# Patient Record
Sex: Female | Born: 1970 | Race: White | Hispanic: No | Marital: Married
Health system: Southern US, Community
[De-identification: ages and names within clinical notes are randomized; demographics above are authoritative.]

---

## 2010-03-09 ENCOUNTER — Ambulatory Visit: Payer: Self-pay | Admitting: Family Medicine

## 2010-09-14 ENCOUNTER — Emergency Department: Payer: Self-pay | Admitting: Unknown Physician Specialty

## 2011-03-10 ENCOUNTER — Emergency Department: Payer: Self-pay | Admitting: Internal Medicine

## 2011-10-10 ENCOUNTER — Emergency Department: Payer: Self-pay | Admitting: Emergency Medicine

## 2011-10-10 LAB — URINALYSIS, COMPLETE
Blood: NEGATIVE
Glucose,UR: NEGATIVE mg/dL (ref 0–75)
Ketone: NEGATIVE
Protein: 30
RBC,UR: 1 /HPF (ref 0–5)
Squamous Epithelial: 3
WBC UR: <1 /HPF (ref 0–5)

## 2011-10-10 LAB — COMPREHENSIVE METABOLIC PANEL
Albumin: 4.2 g/dL (ref 3.4–5.0)
Alkaline Phosphatase: 86 U/L (ref 50–136)
Anion Gap: 11 (ref 7–16)
BUN: 16 mg/dL (ref 7–18)
Bilirubin,Total: 0.3 mg/dL (ref 0.2–1.0)
Calcium, Total: 9.9 mg/dL (ref 8.5–10.1)
Co2: 26 mmol/L (ref 21–32)
Creatinine: 0.93 mg/dL (ref 0.60–1.30)
EGFR (African American): >60
EGFR (Non-African Amer.): >60
Potassium: 3.3 mmol/L — ABNORMAL LOW (ref 3.5–5.1)
SGOT(AST): 29 U/L (ref 15–37)
SGPT (ALT): 28 U/L
Sodium: 140 mmol/L (ref 136–145)
Total Protein: 8.1 g/dL (ref 6.4–8.2)

## 2011-10-10 LAB — CBC
HCT: 43.9 % (ref 35.0–47.0)
HGB: 14.8 g/dL (ref 12.0–16.0)
MCV: 94 fL (ref 80–100)
RBC: 4.69 10*6/uL (ref 3.80–5.20)
RDW: 13.8 % (ref 11.5–14.5)
WBC: 11.4 10*3/uL — ABNORMAL HIGH (ref 3.6–11.0)

## 2011-10-10 LAB — LIPASE, BLOOD: Lipase: 114 U/L (ref 73–393)

## 2011-11-11 ENCOUNTER — Emergency Department: Payer: Self-pay | Admitting: Emergency Medicine

## 2011-11-12 LAB — COMPREHENSIVE METABOLIC PANEL
Alkaline Phosphatase: 115 U/L (ref 50–136)
Anion Gap: 13 (ref 7–16)
Bilirubin,Total: 0.4 mg/dL (ref 0.2–1.0)
Chloride: 101 mmol/L (ref 98–107)
Co2: 21 mmol/L (ref 21–32)
Creatinine: 0.88 mg/dL (ref 0.60–1.30)
Glucose: 128 mg/dL — ABNORMAL HIGH (ref 65–99)
Osmolality: 273 (ref 275–301)
Sodium: 135 mmol/L — ABNORMAL LOW (ref 136–145)
Total Protein: 8.2 g/dL (ref 6.4–8.2)

## 2011-11-12 LAB — URINALYSIS, COMPLETE
Bacteria: NONE SEEN
Bilirubin,UR: NEGATIVE
Glucose,UR: NEGATIVE mg/dL (ref 0–75)
Leukocyte Esterase: NEGATIVE
Ph: 6 (ref 4.5–8.0)
Squamous Epithelial: 1

## 2011-11-12 LAB — CBC
MCHC: 33.4 g/dL (ref 32.0–36.0)
MCV: 92 fL (ref 80–100)
Platelet: 339 10*3/uL (ref 150–440)
RBC: 4.73 10*6/uL (ref 3.80–5.20)

## 2011-11-12 LAB — LIPASE, BLOOD: Lipase: 131 U/L (ref 73–393)

## 2011-12-06 LAB — COMPREHENSIVE METABOLIC PANEL
Alkaline Phosphatase: 71 U/L (ref 50–136)
Anion Gap: 11 (ref 7–16)
BUN: 10 mg/dL (ref 7–18)
Calcium, Total: 9.5 mg/dL (ref 8.5–10.1)
EGFR (African American): >60
EGFR (Non-African Amer.): >60
Glucose: 102 mg/dL — ABNORMAL HIGH (ref 65–99)
Osmolality: 273 (ref 275–301)
Potassium: 3.1 mmol/L — ABNORMAL LOW (ref 3.5–5.1)
SGOT(AST): 17 U/L (ref 15–37)
SGPT (ALT): 22 U/L
Sodium: 137 mmol/L (ref 136–145)
Total Protein: 7.7 g/dL (ref 6.4–8.2)

## 2011-12-06 LAB — CBC
HCT: 43.4 % (ref 35.0–47.0)
HGB: 14.9 g/dL (ref 12.0–16.0)
MCH: 31.4 pg (ref 26.0–34.0)
MCV: 92 fL (ref 80–100)
Platelet: 289 10*3/uL (ref 150–440)
WBC: 11.2 10*3/uL — ABNORMAL HIGH (ref 3.6–11.0)

## 2011-12-06 LAB — LIPASE, BLOOD: Lipase: 182 U/L (ref 73–393)

## 2011-12-07 ENCOUNTER — Inpatient Hospital Stay: Payer: Self-pay | Admitting: Surgery

## 2013-11-07 IMAGING — CT CT STONE STUDY
2 series · 12 of 16 positions shown, 15 images · non-contrast
Comparison: none

REASON FOR EXAM: LUQ and L flank pain
COMMENTS:

PROCEDURE:     CT  - CT ABDOMEN /PELVIS WO (STONE)  - October 10, 2011  [DATE]
RESULT:     Comparison: None
TECHNIQUE: Multiple axial images from the lung bases to the symphysis pubis
were obtained without oral and without intravenous contrast.

[Series 2: soft tissue · axial · 0.74mm/px · z∈[+35,+425]mm · 10 of 160 slices shown, 13 images]
[im 15/160  soft-tissue]
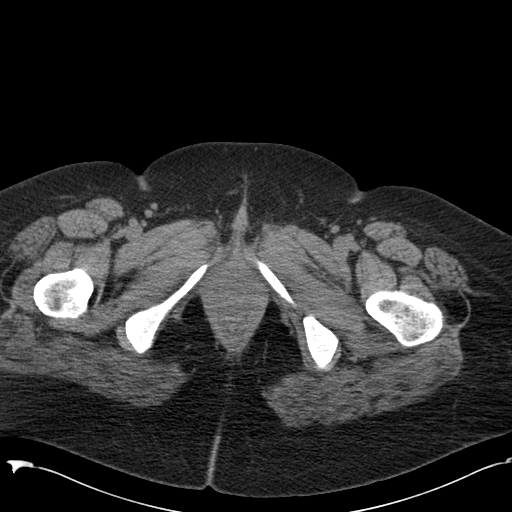
[im 15/160  bone]
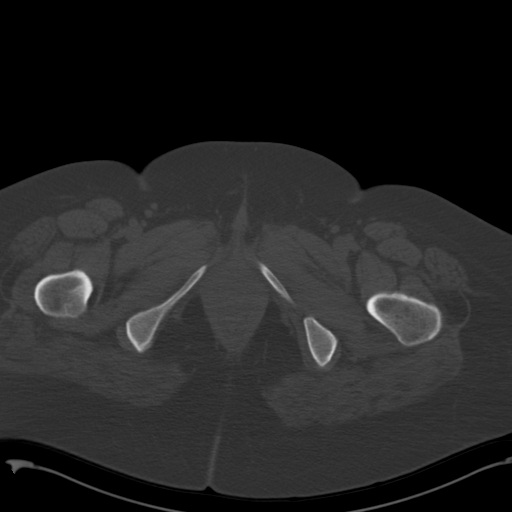
[im 29/160  soft-tissue]
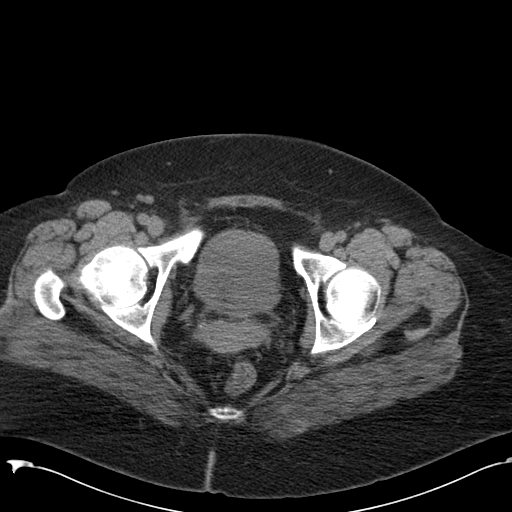
[im 44/160  soft-tissue]
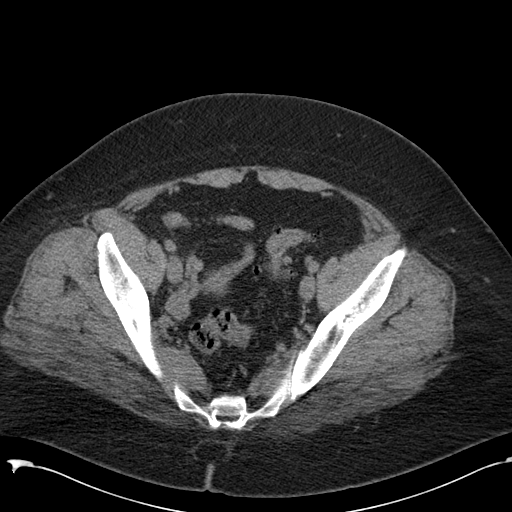
[im 58/160  soft-tissue]
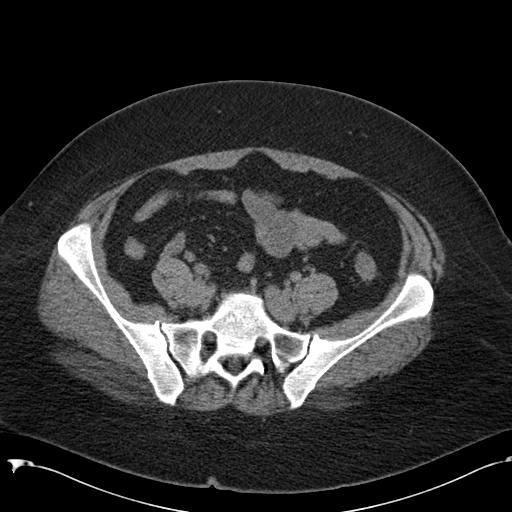
[im 73/160  soft-tissue]
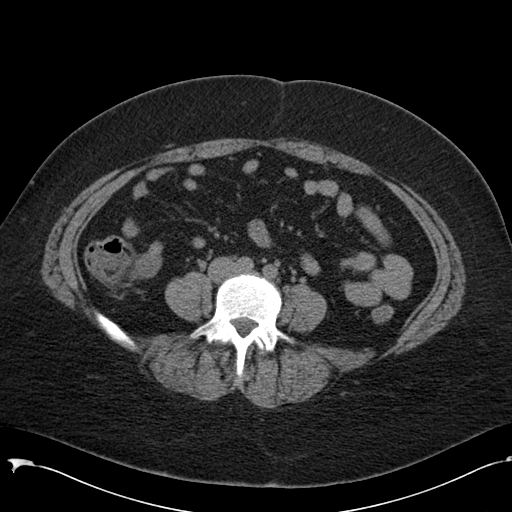
[im 73/160  bone]
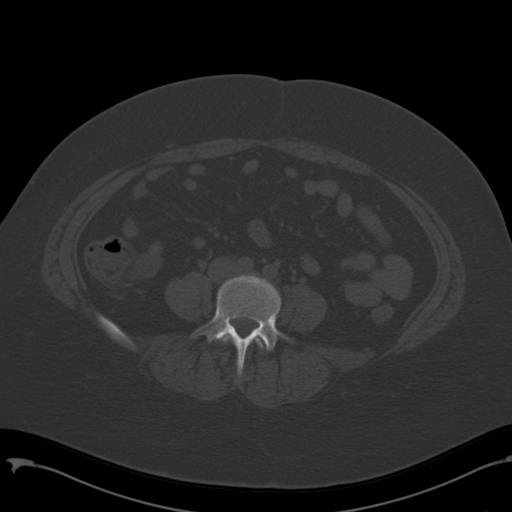
[im 87/160  soft-tissue]
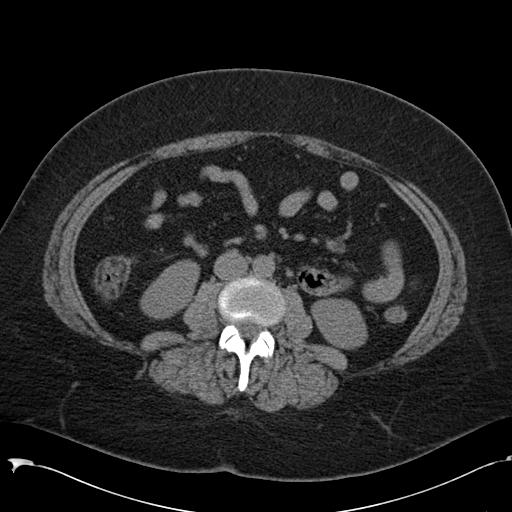
[im 102/160  soft-tissue]
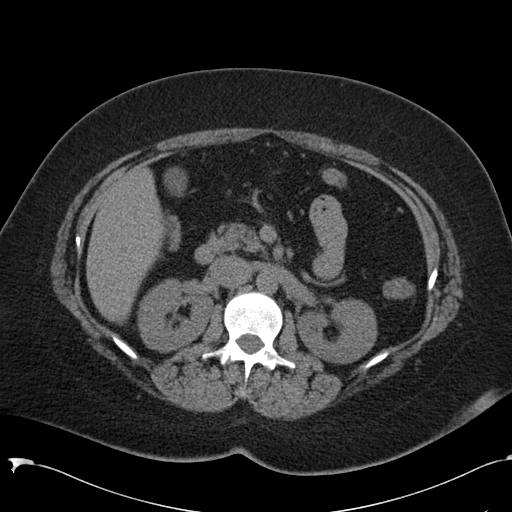
[im 116/160  soft-tissue]
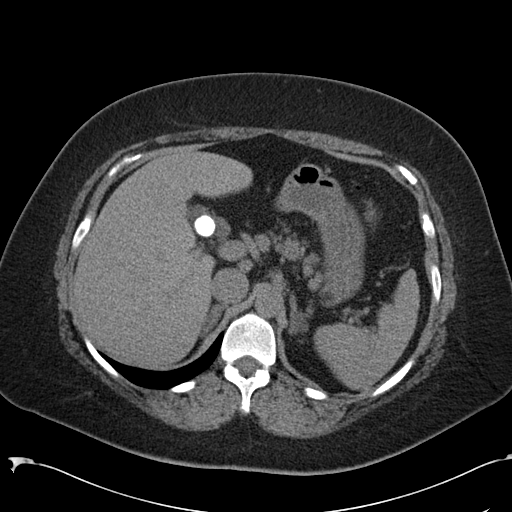
[im 131/160  soft-tissue]
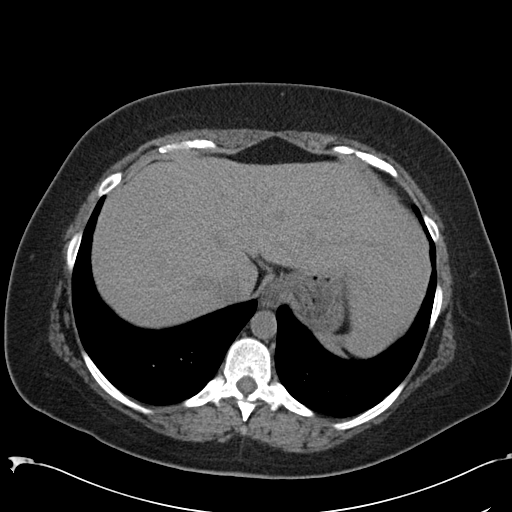
[im 131/160  bone]
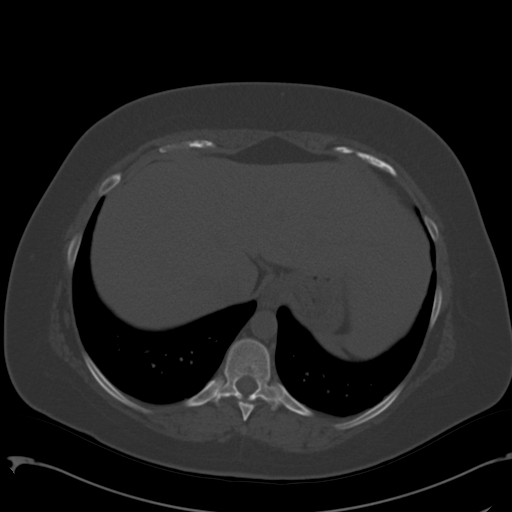
[im 145/160  soft-tissue]
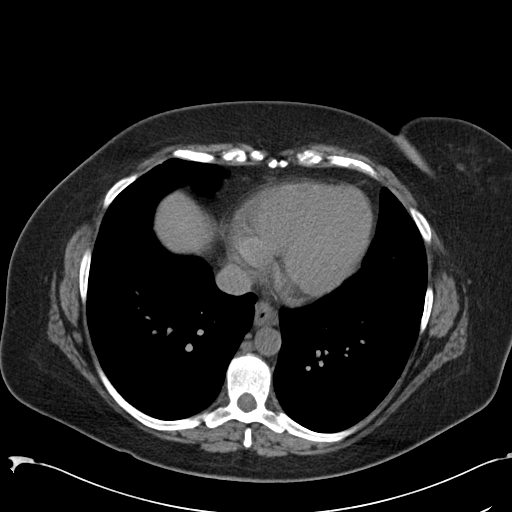

[Series 4: lung · axial · 0.74mm/px · z∈[+362,+416]mm · 2 of 54 slices shown]
[im 18/54  bone]
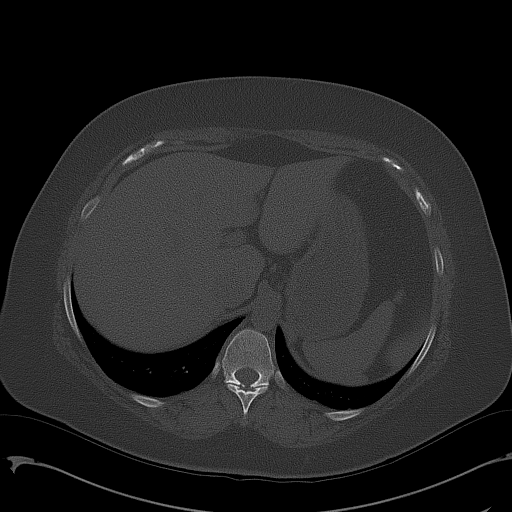
[im 36/54  bone]
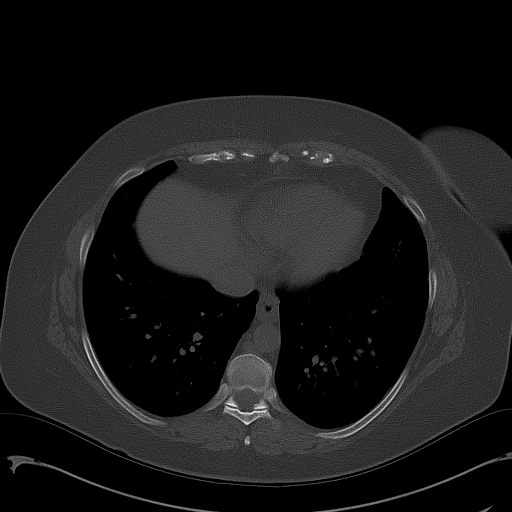

[12 of 16 positions shown; findings below may reference images not displayed]

FINDINGS: Lack of intravenous contrast limits evaluation of the solid abdominal
organs.  Grossly, the liver, spleen, adrenals, and pancreas are
unremarkable. There is a calcified gallstone. No renal calculi or
hydronephrosis. No ureterectasis. The small and large bowel are normal in
caliber. There is diverticulosis of the sigmoid colon. There is
diverticulosis of the descending colon. The appendix is normal.

No aggressive lytic or sclerotic osseous lesions are identified.
IMPRESSION: 1. No renal calculi or hydronephrosis.
2. Cholelithiasis.

## 2013-12-10 IMAGING — US ABDOMEN ULTRASOUND
1 series · 17 of 25 positions shown · non-contrast
Comparison: none

REASON FOR EXAM: EPIGASTRIC/RUQ PAIN
COMMENTS:   May transport without cardiac monitor

[Series 1: abdomen ultrasound · 17 of 61 slices shown]
[im 1/61]
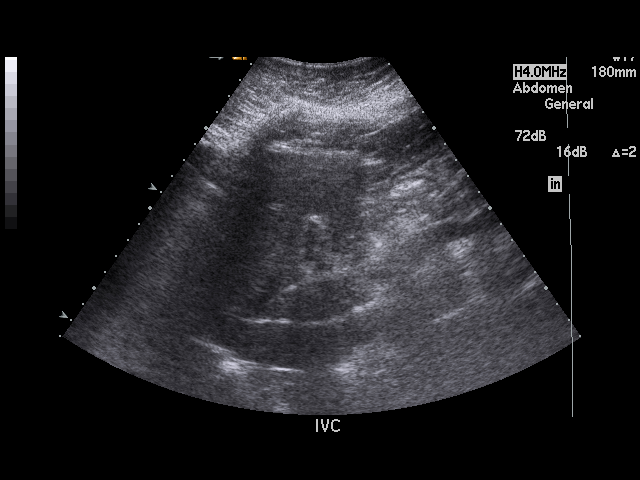
[im 6/61]
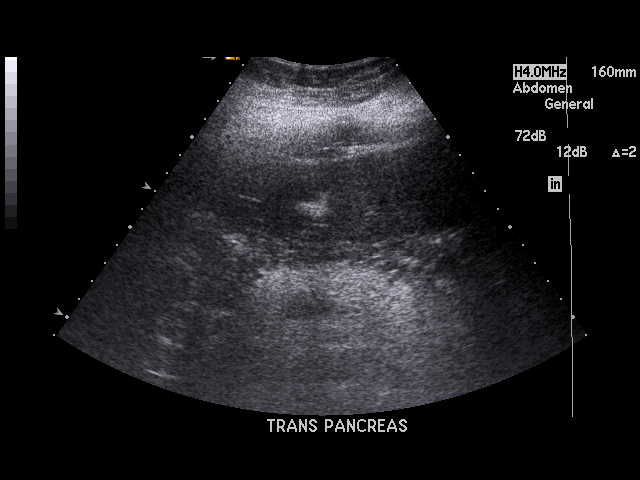
[im 8/61]
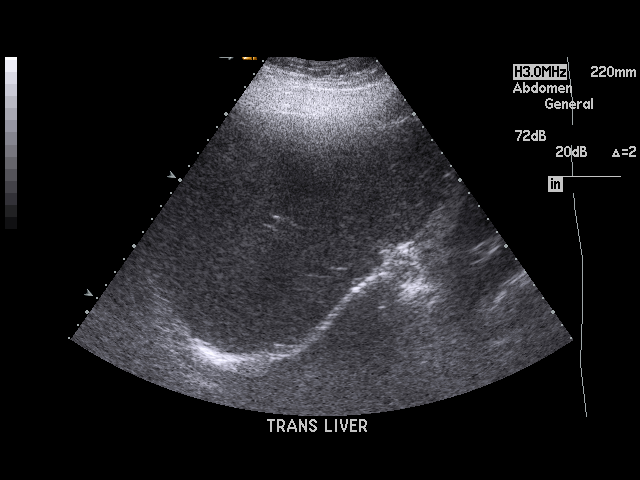
[im 13/61]
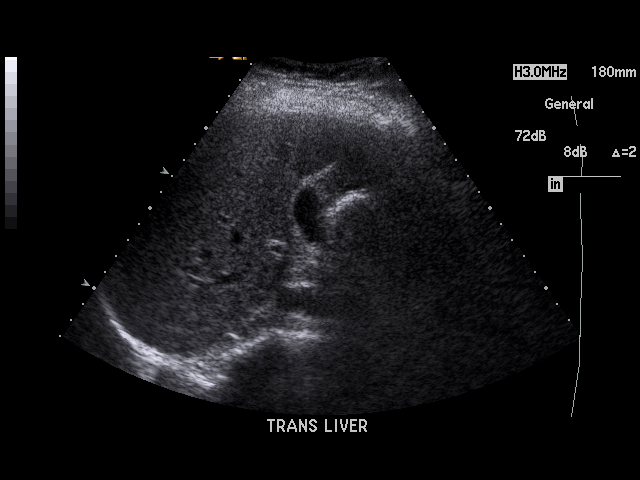
[im 16/61]
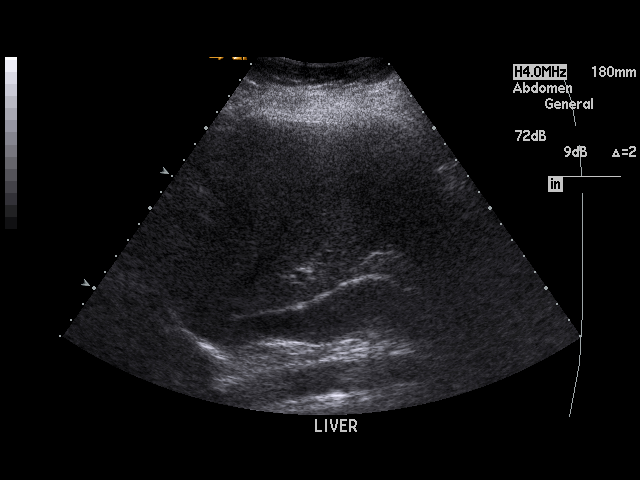
[im 21/61]
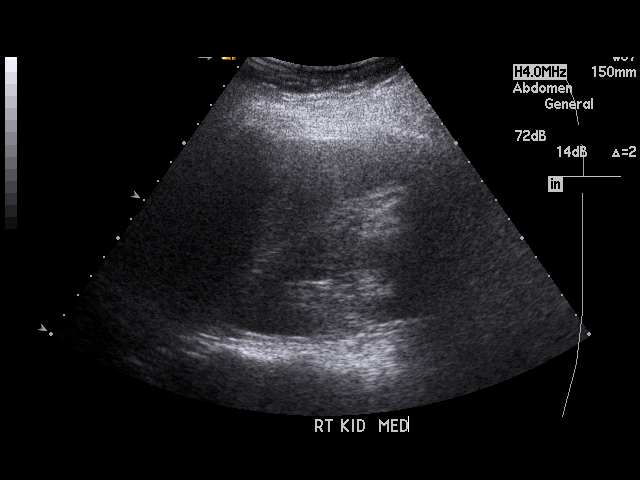
[im 23/61]
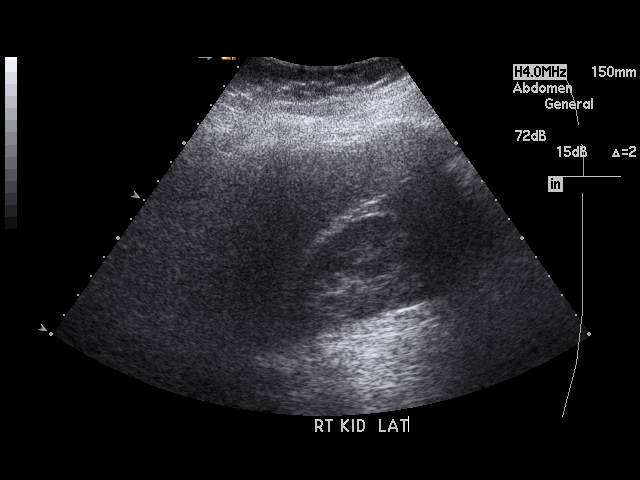
[im 28/61]
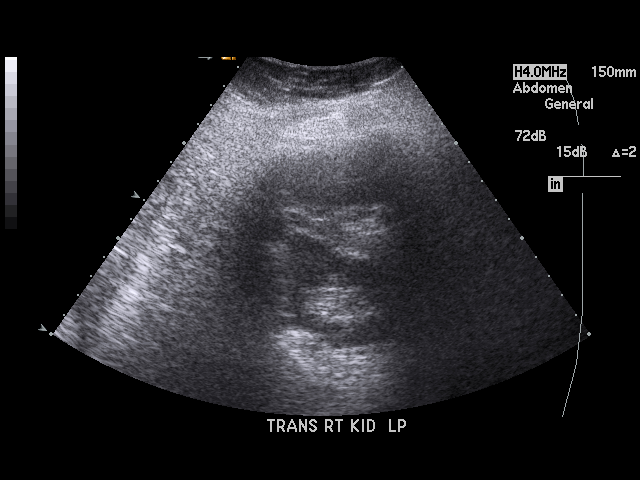
[im 31/61]
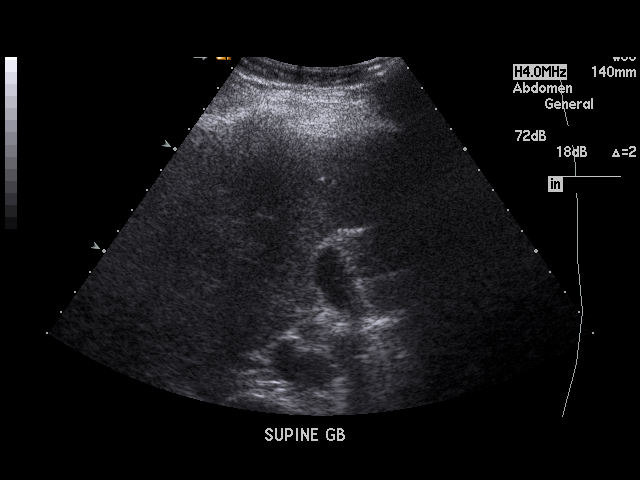
[im 33/61]
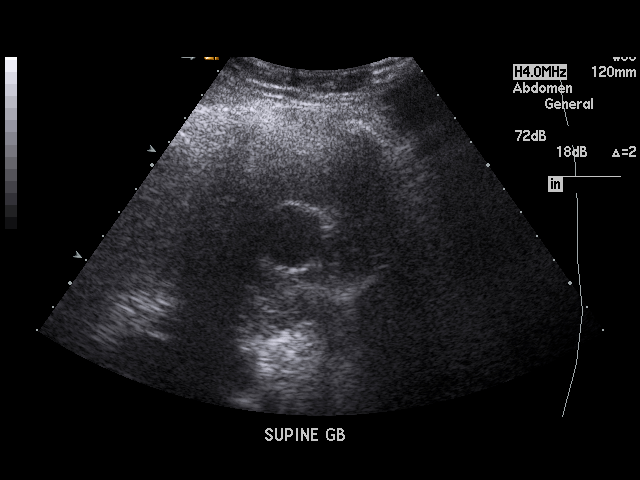
[im 38/61]
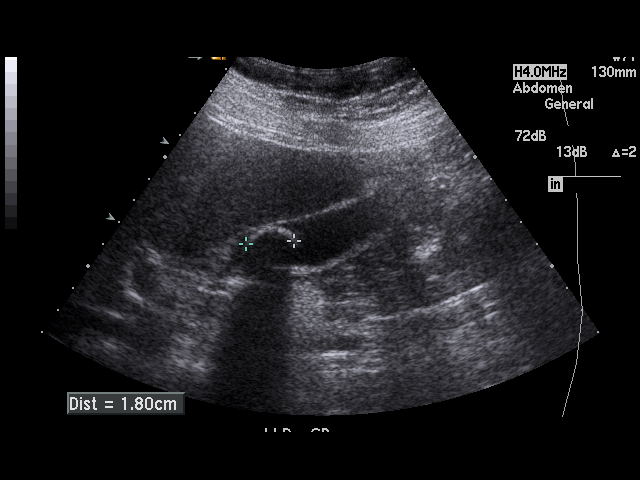
[im 41/61]
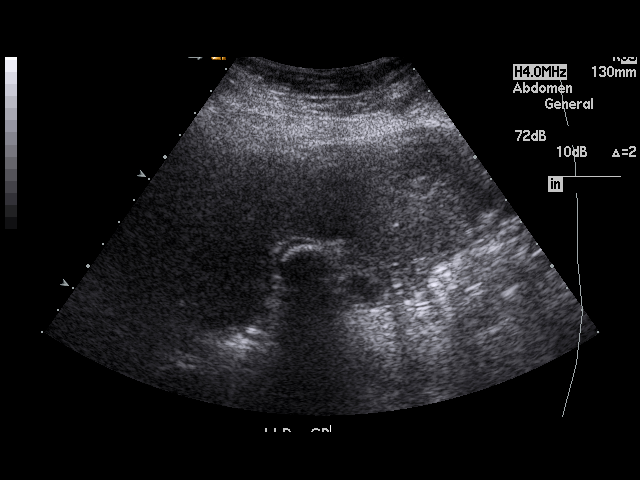
[im 46/61]
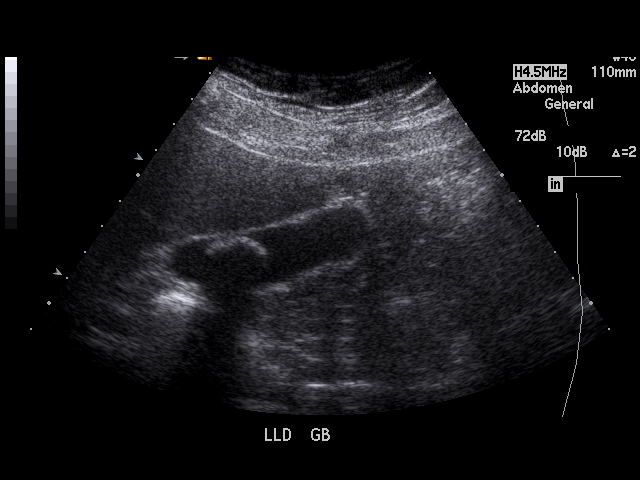
[im 48/61]
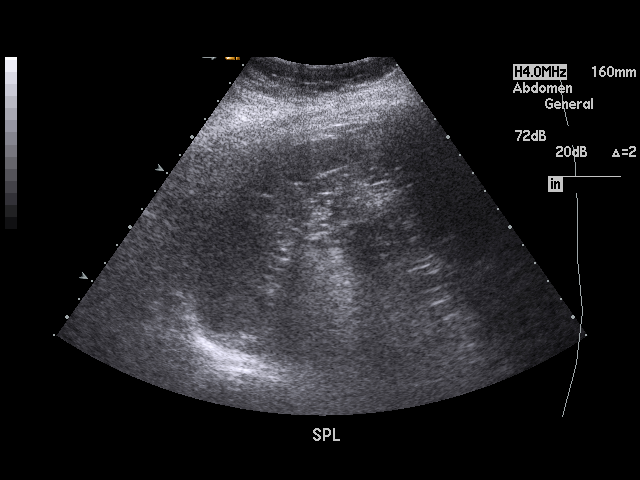
[im 53/61]
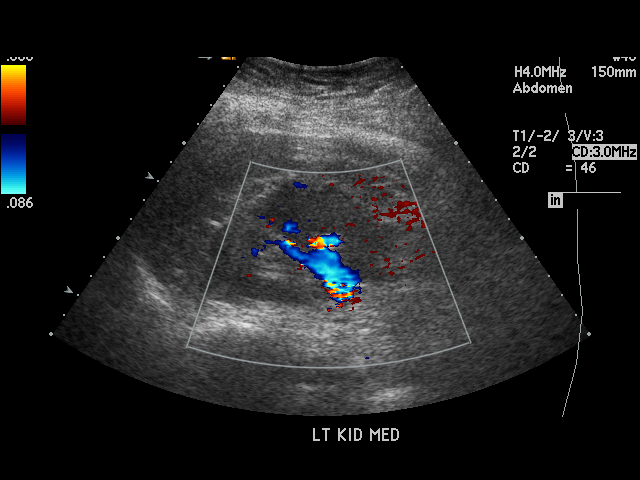
[im 56/61]
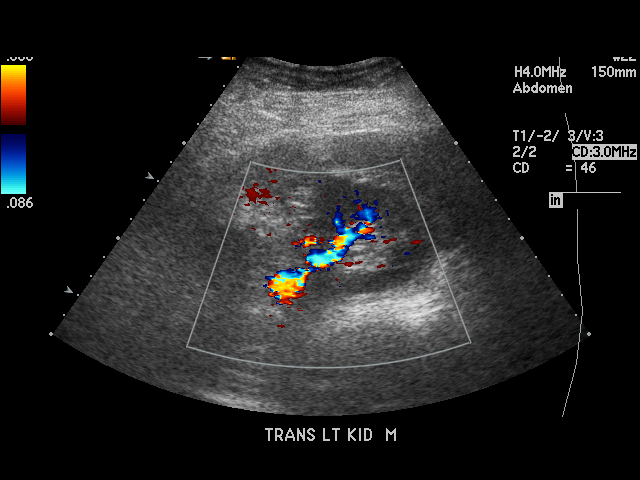
[im 61/61]
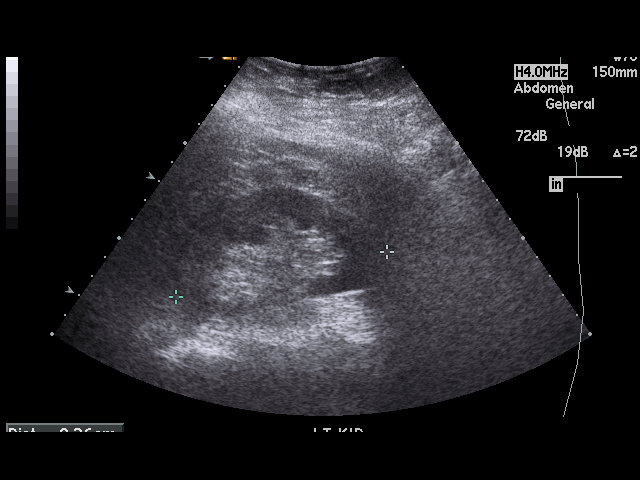

[17 of 25 positions shown; findings below may reference images not displayed]

PROCEDURE:     US  - US ABDOMEN GENERAL SURVEY  - November 12, 2011  [DATE]

RESULT:     The abdominal aorta is normal in caliber. The pancreas is
limited in visualization by overlying bowel gas. The liver appears to be
echoic consistent with fatty infiltration. Cholelithiasis is demonstrated.
Right kidney length is 9.43 cm. A gallstone measures up to 1.8 cm. The
common bile duct diameter is 3.8 mm. Gallbladder wall thickness is 2.7 mm.
There is no pericholecystic fluid or sonographic Murphy's sign. The inferior
vena cava, spleen and left kidney appear unremarkable. Left kidney length is
9.26 cm.
IMPRESSION: 1.     Cholelithiasis with a 1.8 cm stone in the gallbladder. No wall
thickening, sonographic Murphy's sign or pericholecystic fluid.
2.     Limited visualization of the pancreas.
3.     Otherwise, unremarkable exam.

[REDACTED]

## 2014-01-03 IMAGING — US ABDOMEN ULTRASOUND LIMITED
1 series · 14 of 25 positions shown · non-contrast
Comparison: none

REASON FOR EXAM: RUQ pain
COMMENTS:   Body Site: GB and Fossa, CBD, Head of Pancreas; Right Upper Quad

[Series 1: abdomen ultrasound limited · 0.30mm/px · 14 of 38 slices shown]
[im 1/38]
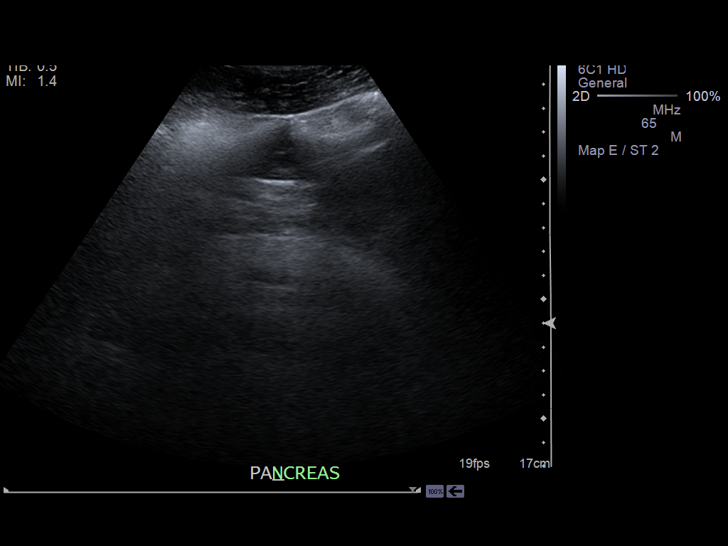
[im 4/38]
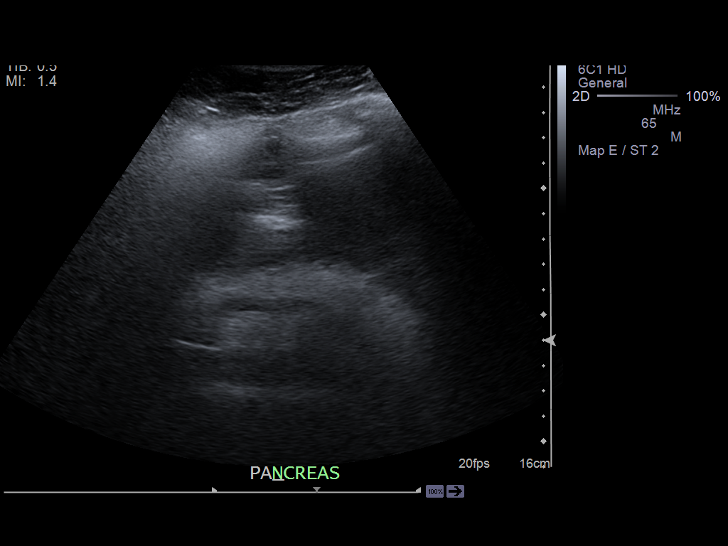
[im 7/38]
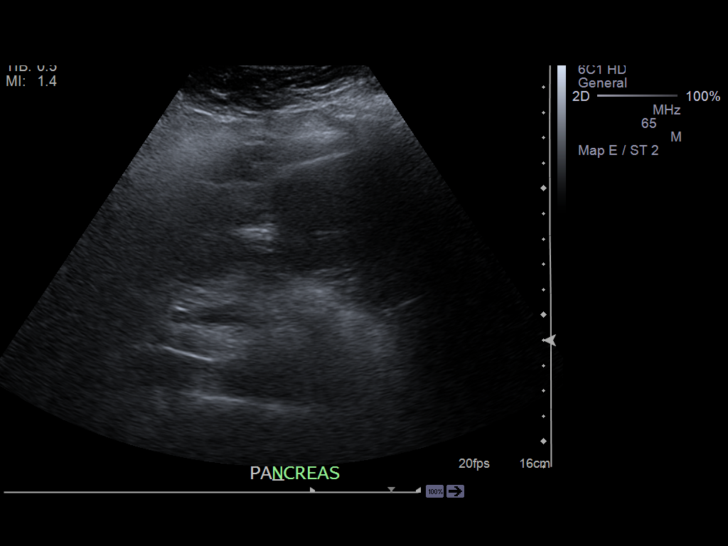
[im 10/38]
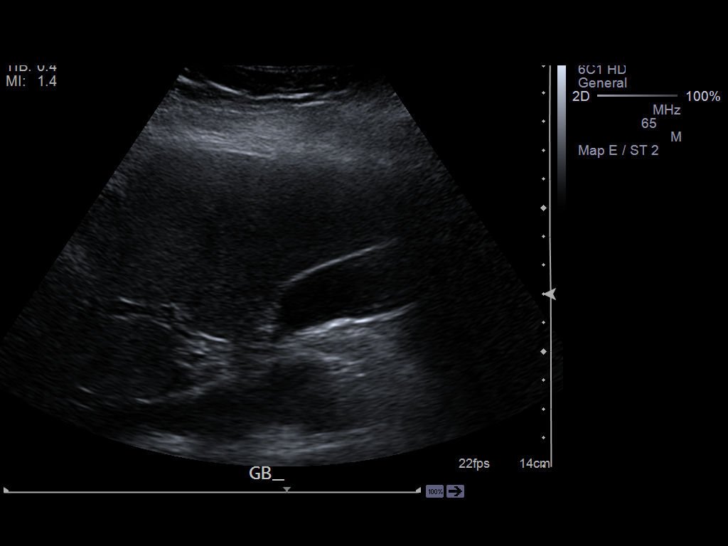
[im 13/38]
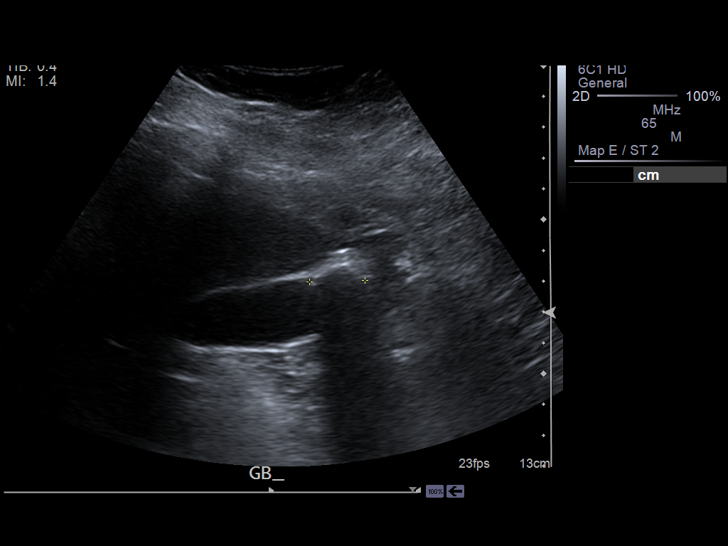
[im 14/38]
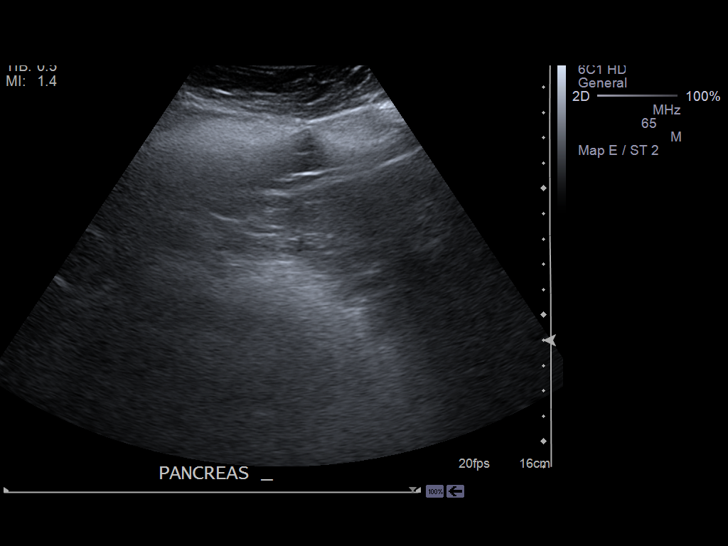
[im 17/38]
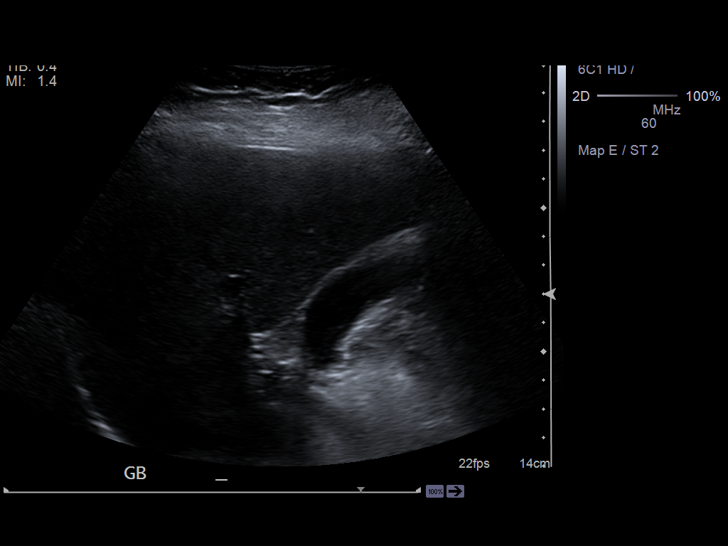
[im 21/38]
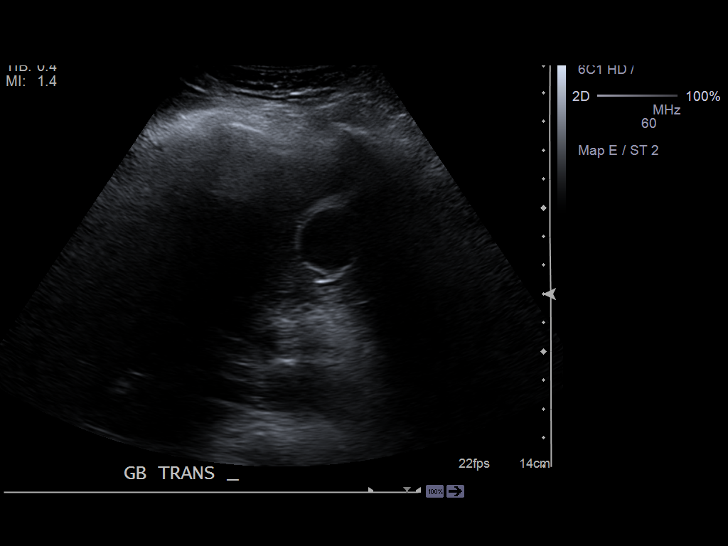
[im 24/38]
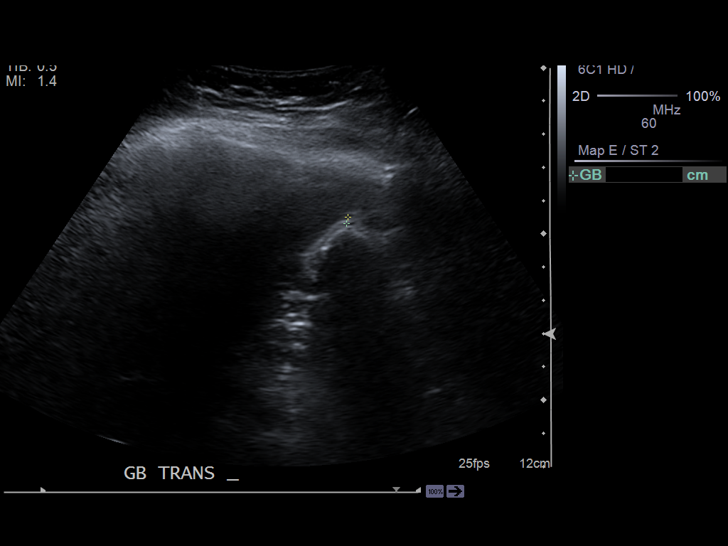
[im 25/38]
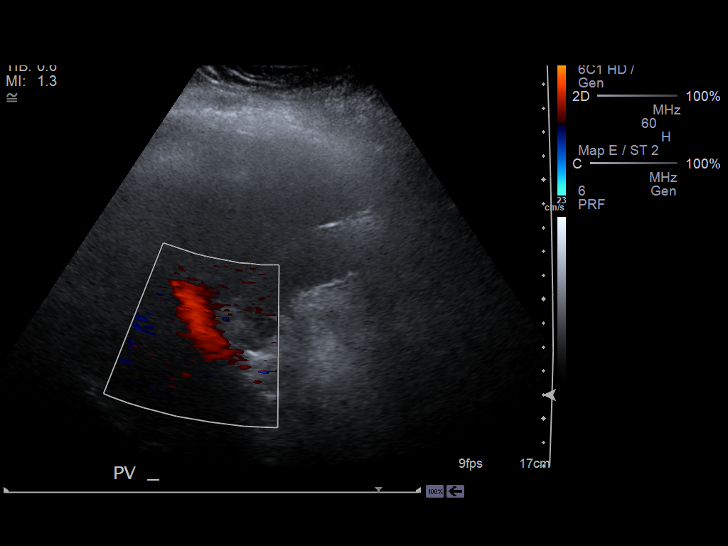
[im 28/38]
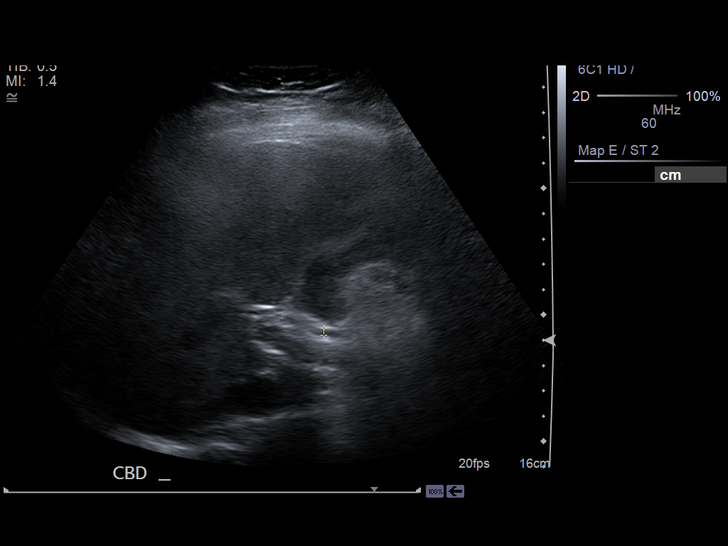
[im 31/38]
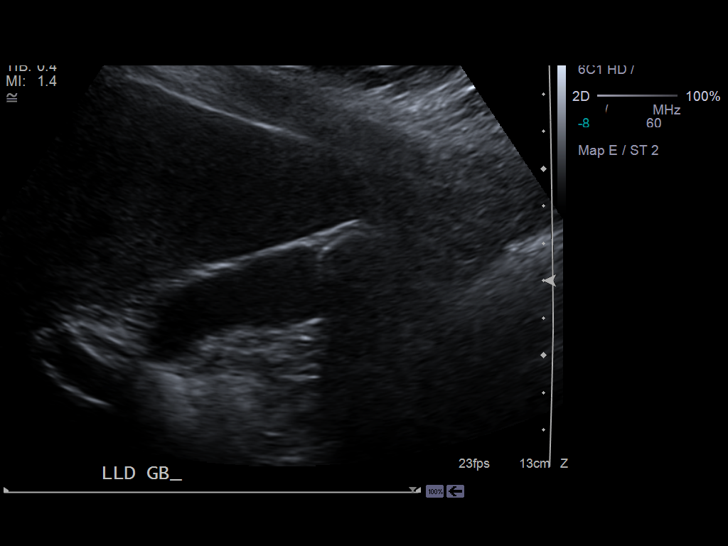
[im 34/38]
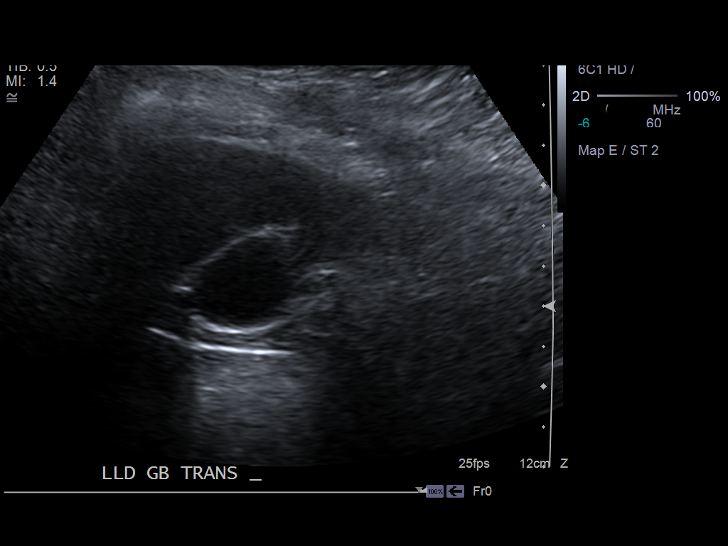
[im 38/38]
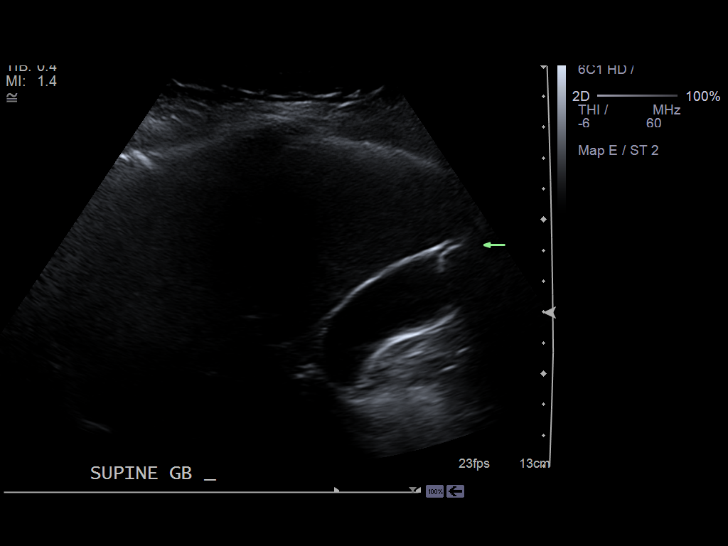

[14 of 25 positions shown; findings below may reference images not displayed]

PROCEDURE:     US  - US ABDOMEN LIMITED SURVEY  - December 06, 2011 [DATE]

RESULT:     Limited examination for evaluation of the biliary tract was
performed. There is observed a single large gallstone. The stone measures
approximately 1.8 cm in diameter. There is no thickening of the gallbladder
wall which measures 2 mm in thickness. The common bile duct measures 3.5 mm
in diameter which is within normal limits. The pancreas is normal in
appearance. The porta hepatis region shows no significant abnormalities.
IMPRESSION: Cholelithiasis. No thickening of the gallbladder wall is
seen and no pericholecystic fluid is identified.

[REDACTED]

## 2014-10-26 NOTE — H&P (Signed)
PATIENT NAME:  Katherine Wright, Katherine Wright MR#:  [id number] DATE OF BIRTH:  Nov 03, 1970  DATE OF ADMISSION:  12/06/2011  ADMITTING DIAGNOSES:  1. Acute calculus cholecystitis and intractable nausea and vomiting.  2. Obesity.  3. Hypertension.   HISTORY: This is a 44 year old white female with an approximately two history of intermittent postprandial abdominal pain in relation to meals associated with nausea and vomiting. She was seen in the emergency room several weeks ago and was then referred to our office. An ultrasound obtained at that time demonstrated a large 1.8 cm gallstone, but no evidence of biliary ductal dilatation. Liver function tests and amylase were normal. White count was elevated at the time. She has had approximately a 40 pound weight loss over the last nine months which has been unintentional. A CT scan obtained during that time demonstrates evidence of the large gallstone which is calcified. She was seen by Dr. Anda KraftMarterre on Thursday, 12/01/2011, in the office, and he felt that she needed a laparoscopic cholecystectomy, which is arranged for 12/20/2011. The patient over the weekend continued to feel poorly and was eating only broth with persistent nausea and vomiting and epigastric and right upper quadrant abdominal pain. She brought herself to the emergency room today. She has been taking Phenergan suppositories with no relief. She was evaluated in the emergency room at which point surgical services were contacted. While in the emergency room, repeat liver function tests were obtained which were normal. White count was 11.2, hemoglobin 14.9, platelet count 289,000, and potassium 3.1. Furthermore an ultrasound was obtained of the right upper quadrant which demonstrates cholelithiasis and a 1.8 cm single large stone, gallbladder wall thickness measuring 2 mm, and the common bile duct measuring 3.5 mm.  lipase normal as well.  DRUG ALLERGIES: Penicillin.   MEDICATIONS:   1. Hydrochlorothiazide. 2. Promethazine suppositories. 3. Lisinopril 10 mg by mouth once a day.   PAST MEDICAL HISTORY: Hypertension.   PAST SURGICAL HISTORY: Oral surgery.   SOCIAL HISTORY: She smokes but does not drink.   FAMILY HISTORY: Noncontributory.   PHYSICAL EXAMINATION:   GENERAL: The patient is tearful with emesis basin in hand.   VITAL SIGNS: Temperature is 97.8, pulse 56, and blood pressure 124/80.   LUNGS: Clear.   HEART: Regular rate and rhythm.   ABDOMEN: Soft but tender in the right upper quadrant and epigastrium with Murphy's sign that is positive.   EXTREMITIES: Warm and well perfused.   NEUROLOGIC/PSYCHIATRIC: Examinations are grossly normal. Cranial nerves are intact. Facies are symmetrical. Extraocular muscles are intact.  LABS/STUDIES: Laboratory values are as described above.   Ultrasound was personally reviewed and as described above.   IMPRESSION: A 44 year old obese white female with a history of hypertension and what looks like acute calculus cholecystitis with intractable nausea and vomiting.   PLAN: The patient will be admitted, hydrated, and begun on intravenous antibiotics consisting of Cipro in consideration after stabilization for laparoscopic cholecystectomy at an earlier time. I will discuss this case with Dr. Anda KraftMarterre later this evening. I will make the patient n.p.o. in case he would like to operate on her tonight.  If not I will have time available wed late morning to do so, ____________________________ Redge GainerMark A. Egbert GaribaldiBird, MD mab:slb D: 12/06/2011 14:35:32 ET T: 12/06/2011 14:56:02 ET JOB#: [id number]  cc: Loraine LericheMark A. Egbert GaribaldiBird, MD, <Dictator>  Raynald KempMARK A Camron Monday MD ELECTRONICALLY SIGNED 12/06/2011 18:13

## 2014-10-26 NOTE — Op Note (Signed)
PATIENT NAME:  Katherine Wright, Katherine MR#:  [id number] DATE OF BIRTH:  [birth year]  DATE OF PROCEDURE:  12/07/2011  PREOPERATIVE DIAGNOSIS: Acute calculus cholecystitis.   POSTOPERATIVE DIAGNOSES: Chronic calculus cholecystitis and biliary colic.   PROCEDURE PERFORMED: Laparoscopic cholecystectomy.   ATTENDING SURGEON: Malosi Hemstreet A. Egbert GaribaldiBird, MD  FINDINGS: Large stone.   SPECIMENS: Gallbladder with contents.   ESTIMATED BLOOD LOSS: 25 mL.   DRAINS: None. LAP and needle count correct x2.   DESCRIPTION OF PROCEDURE: With the patient in the supine position, general oral endotracheal anesthesia was induced. The patient's left arm was padded and tucked at her side. Her abdomen was widely prepped and draped utilizing ChloraPrep solution. Perioperative antibiotics and deep vein thrombosis prophylaxis being administered. Surgical timeout was observed.   A 12 mm blunt Hassan trocar was placed through an open technique with stay sutures being passed through the fascia through an infraumbilical transversely oriented skin incision. Pneumoperitoneum was established. Three 5 mm bladeless trocars were placed, one in the mid epigastrium, two in the right subcostal margin laterally. gallbladder was not acutely inflamed. It was grasped along its fundus and elevated towards the right shoulder. Chronic appearing attachments of the omentum were taken down with sharp technique and point cautery. Lateral traction was placed on Hartman's pouch. The hepatoduodenal ligament was then dissected out with a combination of blunt technique and hook cautery through the peritoneal layer exposing a cystic duct and single cystic artery. A critical view of safety cholecystectomy was performed. This cystic duct gallbladder junction was clearly identified for some length. Two 10 mm clips were then placed on the portal side of the cystic duct, one on the gallbladder side. The structure was then divided. The cystic artery was likewise divided between  hemoclips. The gallbladder was then retrieved off the gallbladder fossa and one small posterior branch of the cystic artery was divided midway up with two hemoclips. Gallbladder was then captured in an Endo Catch device and retrieved easily. The right upper quadrant was irrigated with a total of 2 liters of warm normal saline and aspirated dry. During the dissection a small amount of bile was spilled and immediately aspirated. Hemostasis was ensured on the operative field with point cautery in the gallbladder fossa. All fluid was aspirated. Omentum was laid into the gallbladder fossa. Ports were then removed under direct visualization.   The infraumbilical fascial defect was closed with a figure-of-eight #0 Vicryl suture in vertical orientation. The existing stay sutures being tied to each other. A total of 30 mL of 0.25% plain Marcaine was infiltrated along all skin and fascial incisions prior to closure.   4-0 Vicryl subcuticular was applied to all skin edges. Benzoin, Steri-Strips, Telfa, and Tegaderm were then applied. The patient was then subsequently extubated and taken to the recovery room in stable and satisfactory condition by anesthesia services.    ____________________________ Redge GainerMark A. Egbert GaribaldiBird, MD mab:cms D: 12/07/2011 13:12:38 ET T: 12/07/2011 13:33:30 ET JOB#: [id number]  cc: Loraine LericheMark A. Egbert GaribaldiBird, MD, <Dictator> Terral Cooks A Travares Nelles MD ELECTRONICALLY SIGNED 12/08/2011 20:31
